# Patient Record
Sex: Female | Born: 1945 | Race: White | Hispanic: No | Smoking: Never smoker
Health system: Southern US, Community
[De-identification: ages and names within clinical notes are randomized; demographics above are authoritative.]

## PROBLEM LIST (undated history)

## (undated) DIAGNOSIS — N189 Chronic kidney disease, unspecified: Secondary | ICD-10-CM

## (undated) DIAGNOSIS — M4800 Spinal stenosis, site unspecified: Secondary | ICD-10-CM

## (undated) DIAGNOSIS — I10 Essential (primary) hypertension: Secondary | ICD-10-CM

## (undated) DIAGNOSIS — K4090 Unilateral inguinal hernia, without obstruction or gangrene, not specified as recurrent: Secondary | ICD-10-CM

---

## 1979-08-07 HISTORY — PX: ABDOMINAL HYSTERECTOMY: SHX81

## 2009-06-17 ENCOUNTER — Emergency Department: Payer: Self-pay | Admitting: Emergency Medicine

## 2010-07-06 ENCOUNTER — Ambulatory Visit: Payer: Self-pay | Admitting: Family Medicine

## 2011-09-14 ENCOUNTER — Ambulatory Visit: Payer: Self-pay | Admitting: Family Medicine

## 2011-09-19 ENCOUNTER — Ambulatory Visit: Payer: Self-pay | Admitting: Family Medicine

## 2011-09-25 ENCOUNTER — Ambulatory Visit: Payer: Self-pay | Admitting: Internal Medicine

## 2011-09-26 ENCOUNTER — Ambulatory Visit: Payer: Self-pay | Admitting: Internal Medicine

## 2012-11-13 IMAGING — CR DG CHEST 2V
1 series · 2 of 2 positions shown · non-contrast
Comparison: none

REASON FOR EXAM: wheezing
COMMENTS:

[Series 1: pa · 0.17mm/px · 2 of 2 slices shown]
[im 1/2]
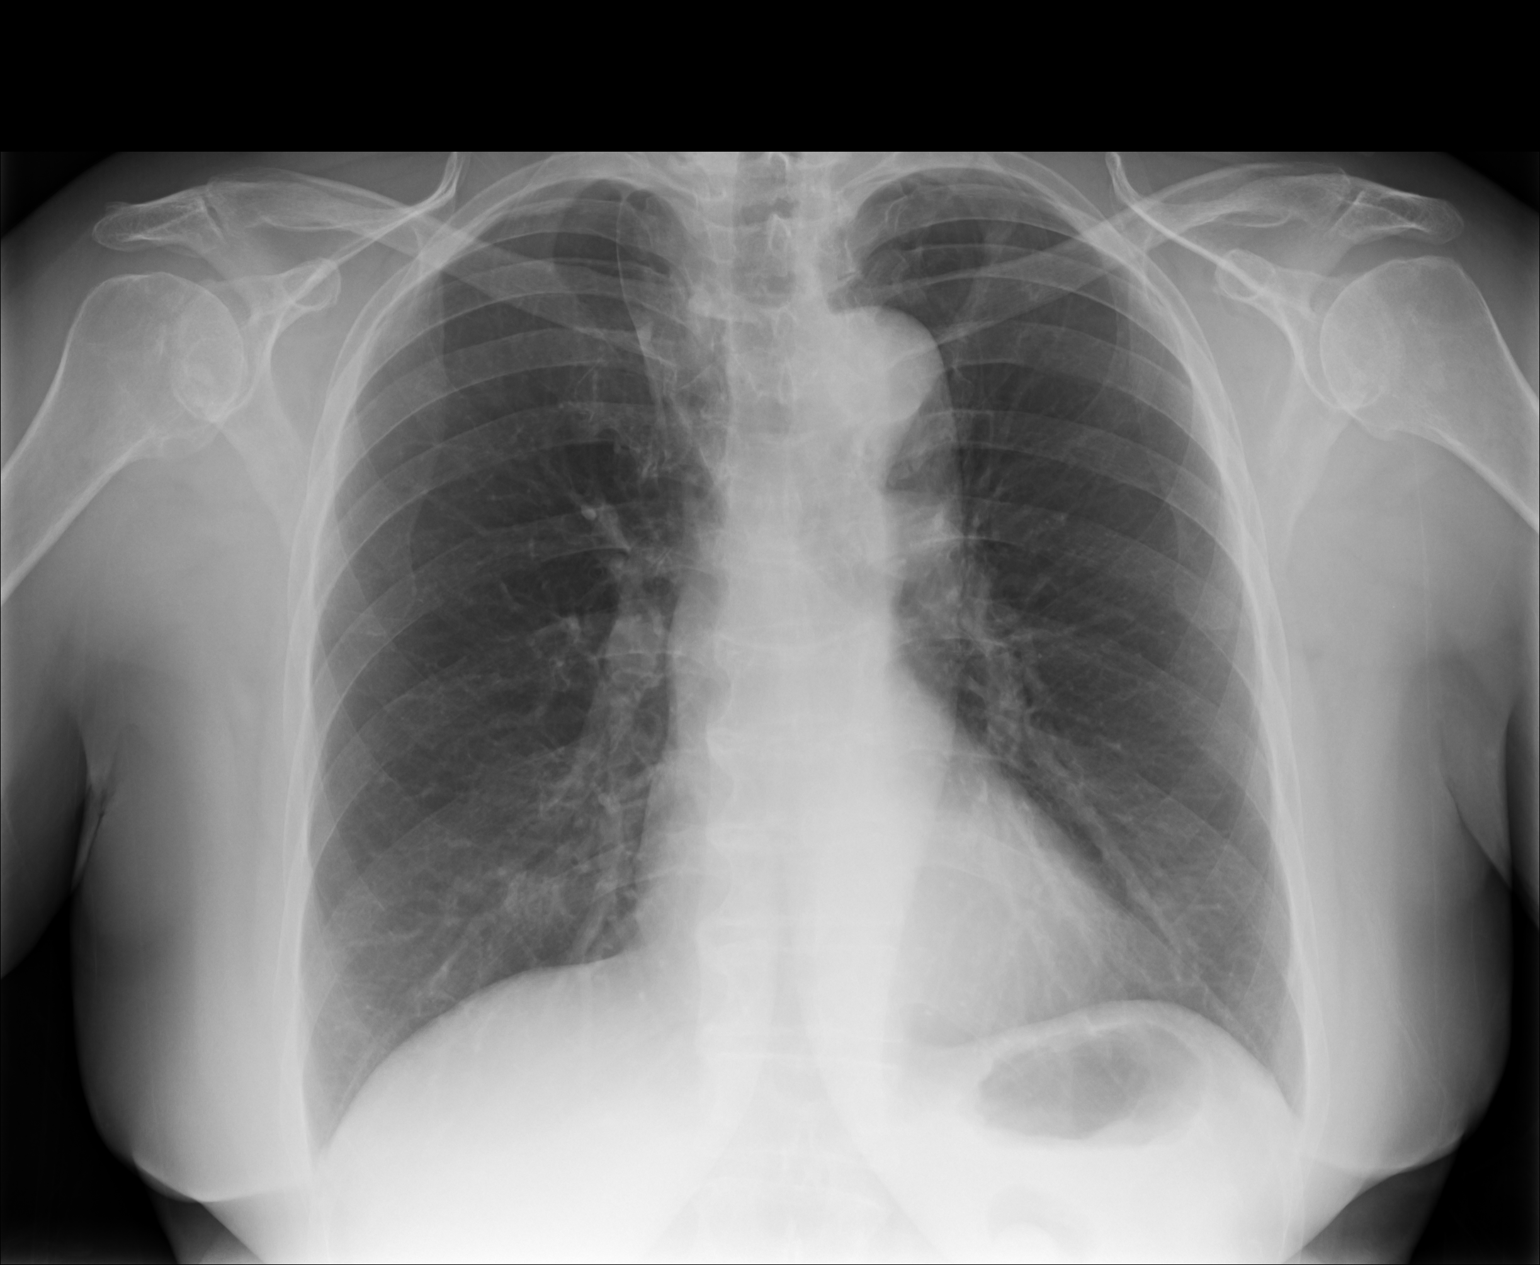
[im 2/2]
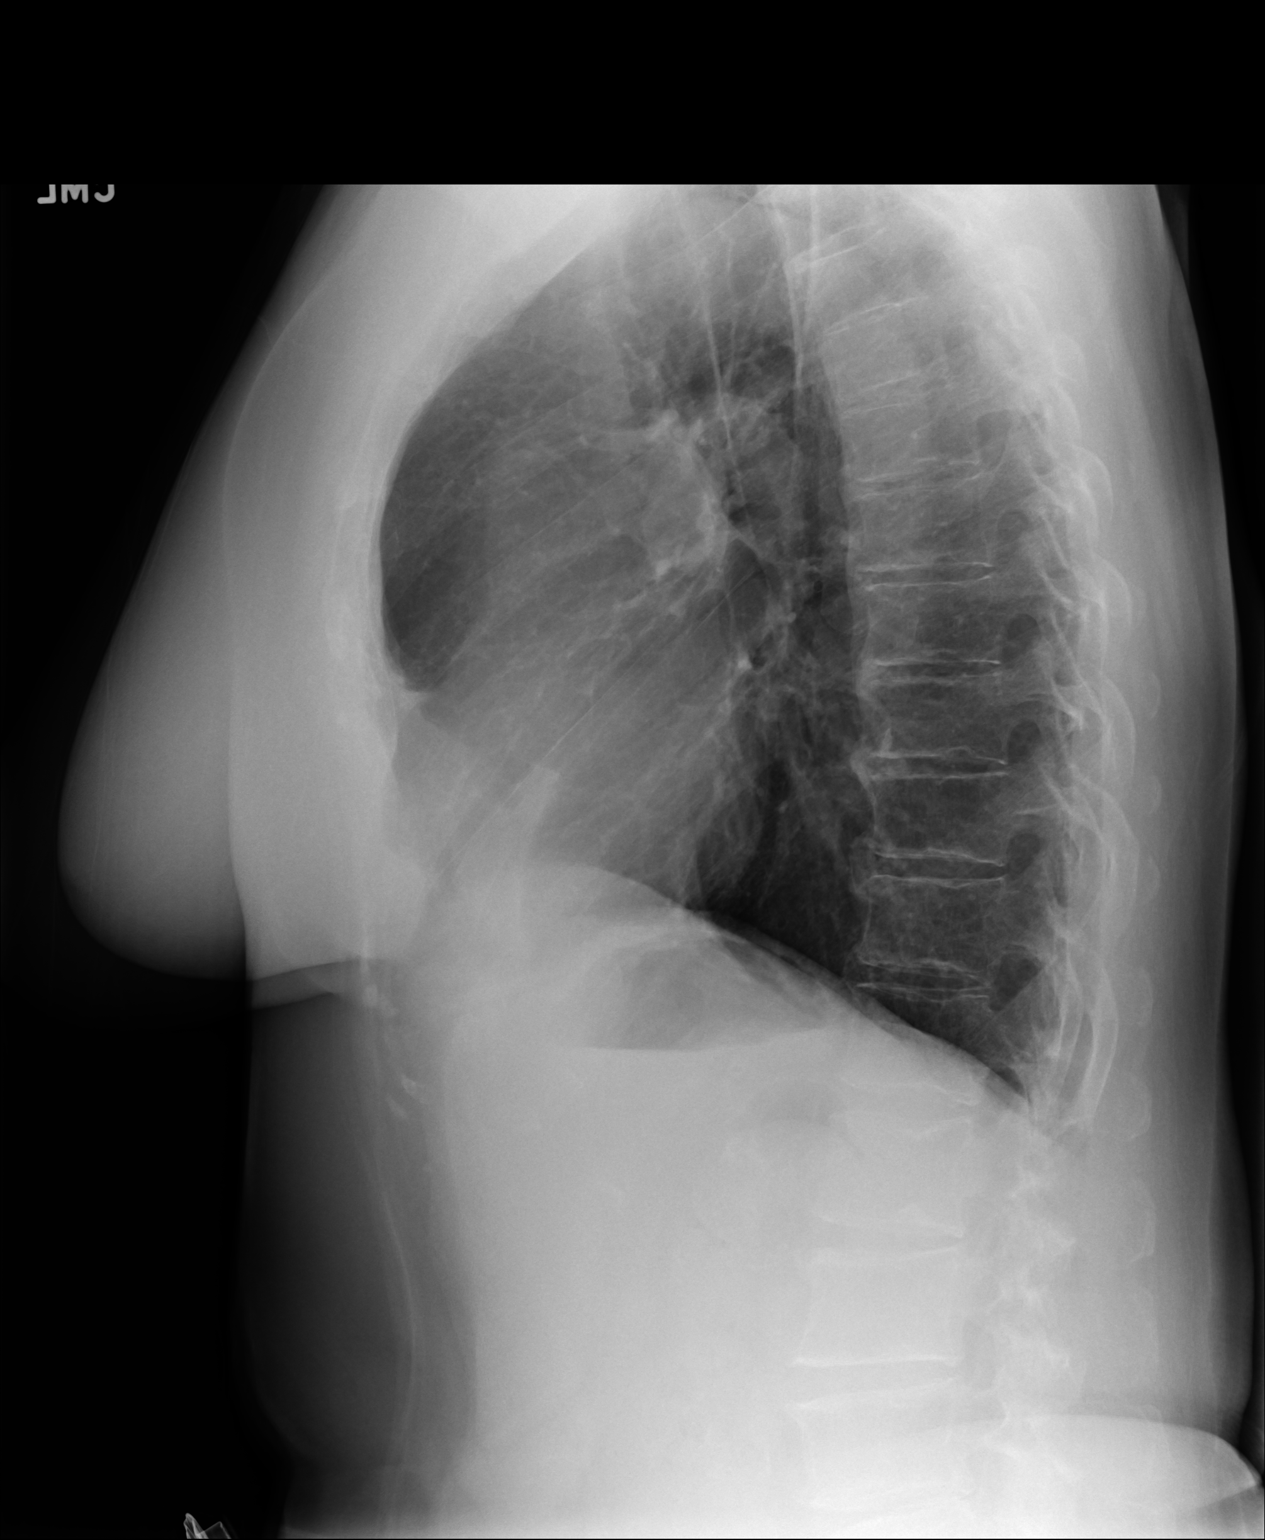

[2 of 2 positions shown; findings below may reference images not displayed]

PROCEDURE:     MDR - MDR CHEST PA(OR AP) AND LATERAL  - September 14, 2011 [DATE]

RESULT:     The lung fields are clear. No pneumonia, pneumothorax or pleural
effusion is seen. Heart size is normal. The lungs are bilaterally
hyperinflated which suggests a history of COPD or asthma. Incidental note is
made of an azygos lobe on the right, a normal variant.
IMPRESSION: 1. The lung fields are clear.
2. Heart size is normal.
3. The lungs appear bilaterally hyperinflated compatible with a history of
COPD or asthma.

## 2012-11-18 IMAGING — US US THYROID
1 series · 17 of 25 positions shown · non-contrast
Comparison: none

REASON FOR EXAM: thyroid nodule goiter
COMMENTS:

[Series 1: us thyroid · 17 of 104 slices shown]
[im 1/104]
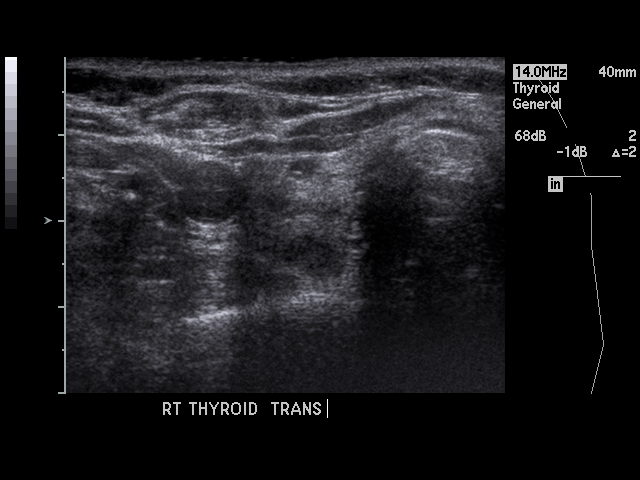
[im 9/104]
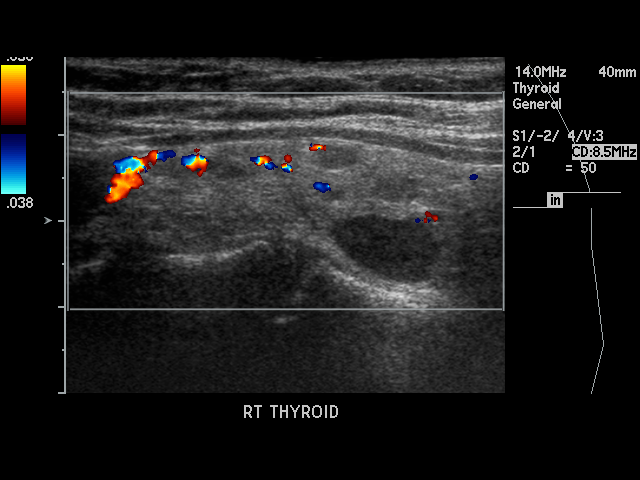
[im 13/104]
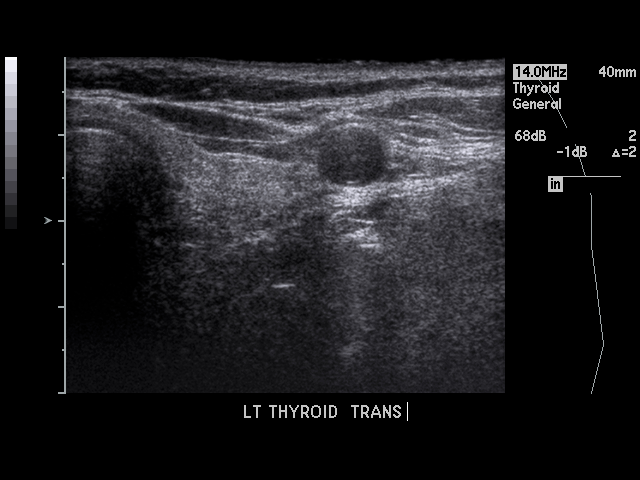
[im 22/104]
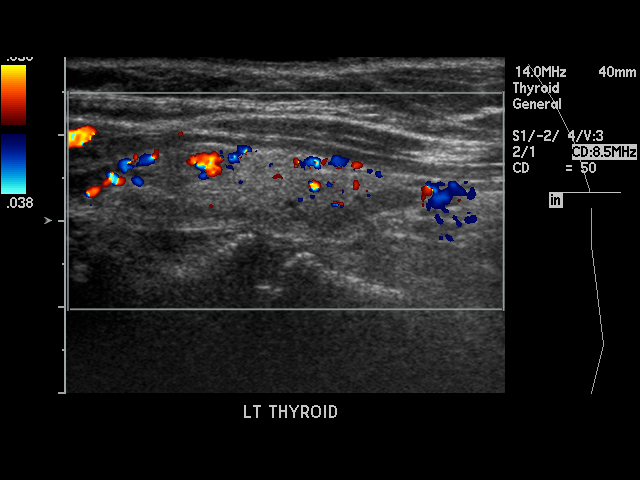
[im 26/104]
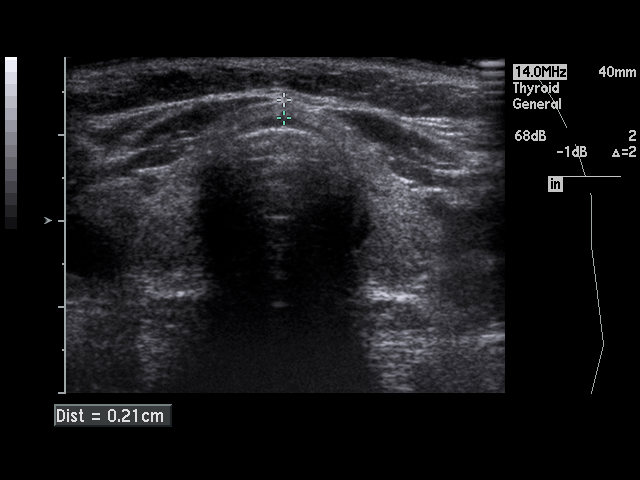
[im 35/104]
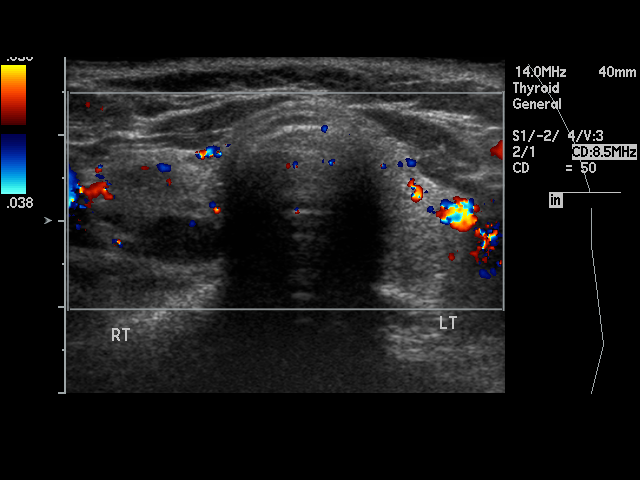
[im 39/104]
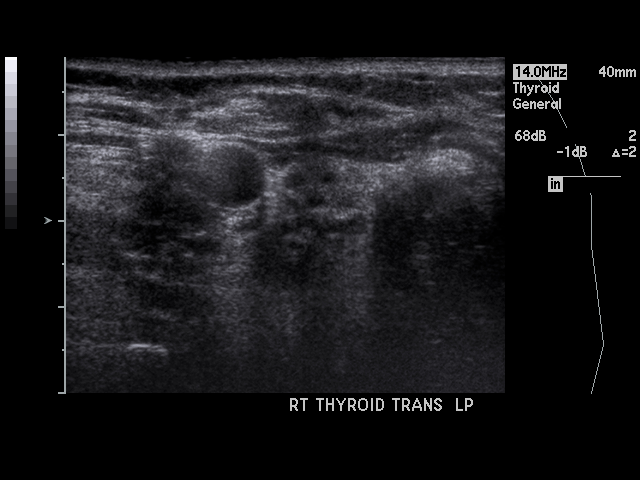
[im 48/104]
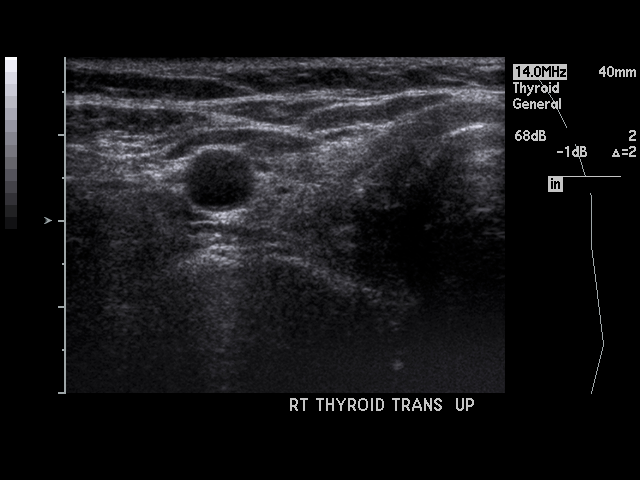
[im 52/104]
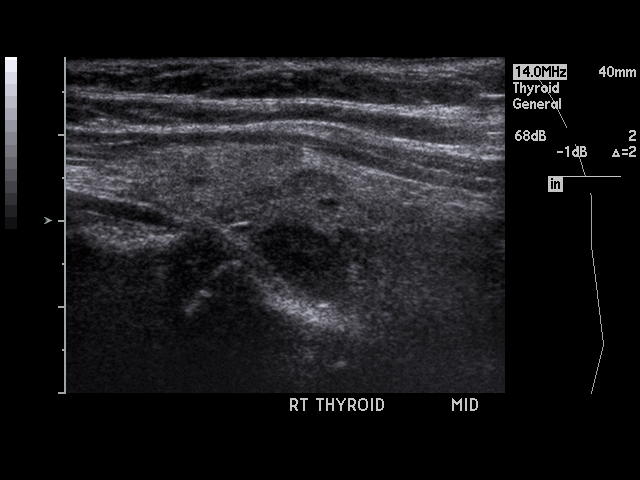
[im 56/104]
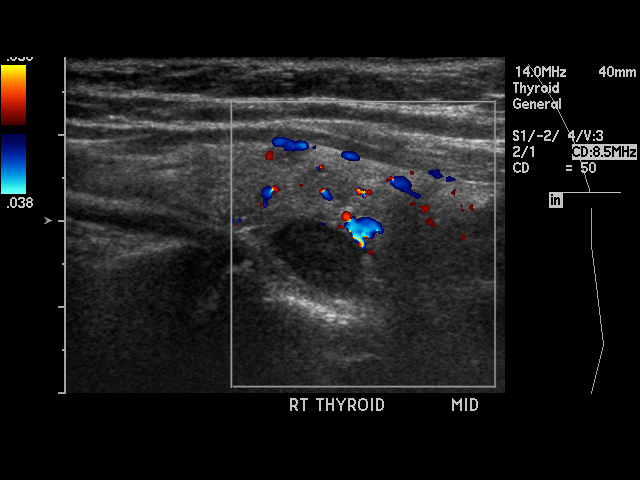
[im 65/104]
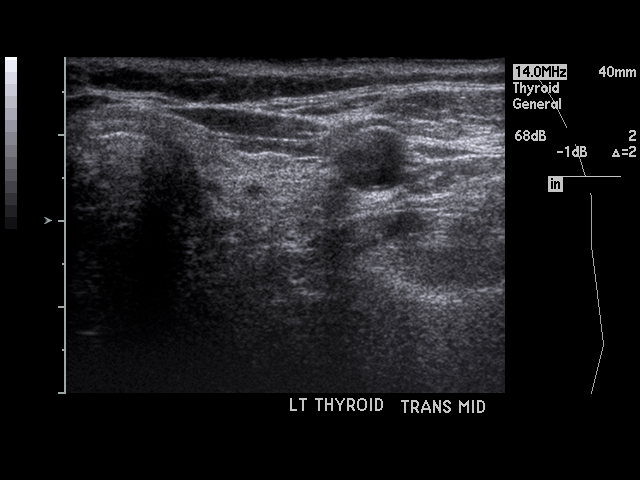
[im 69/104]
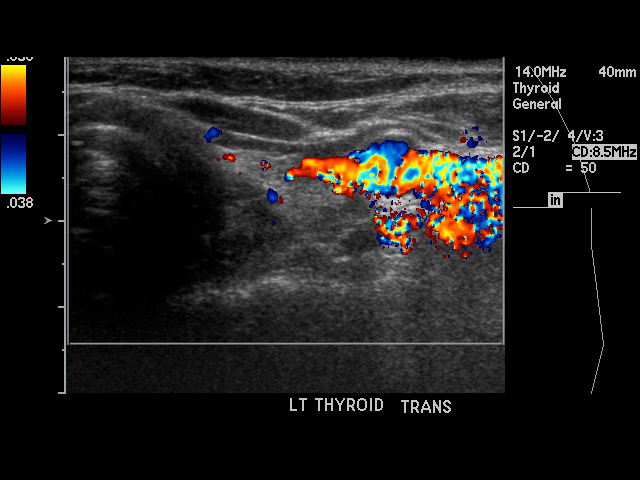
[im 78/104]
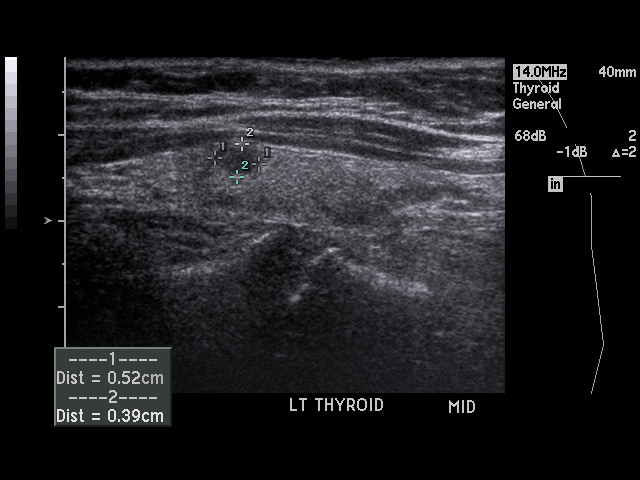
[im 82/104]
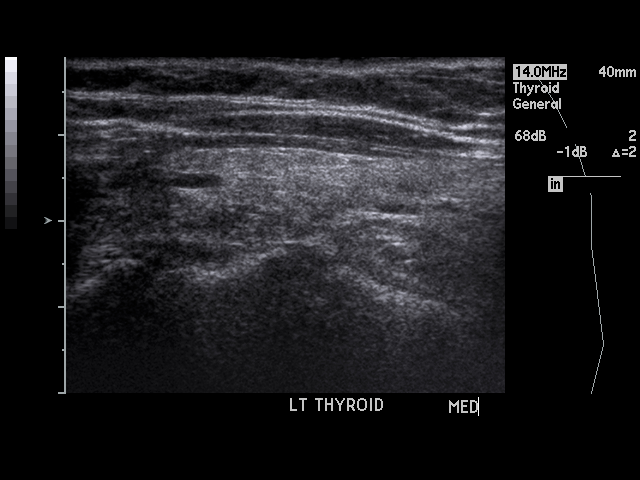
[im 91/104]
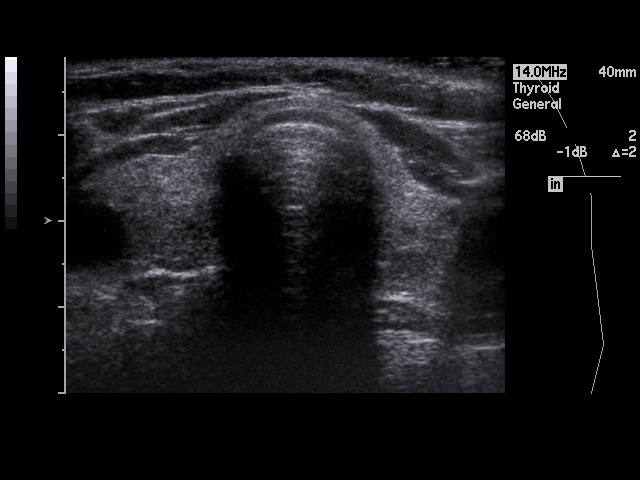
[im 95/104]
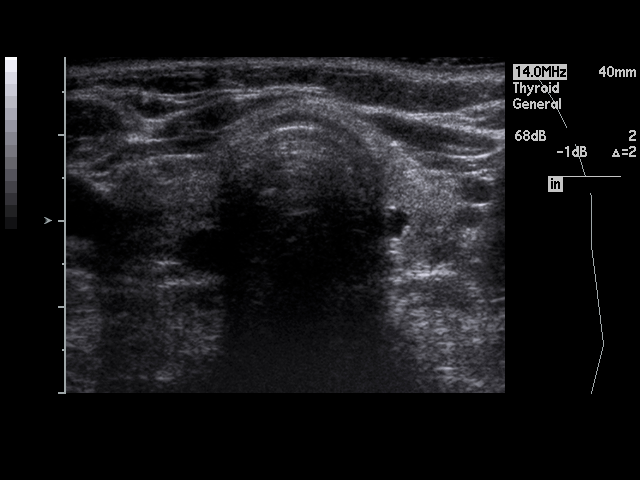
[im 104/104]
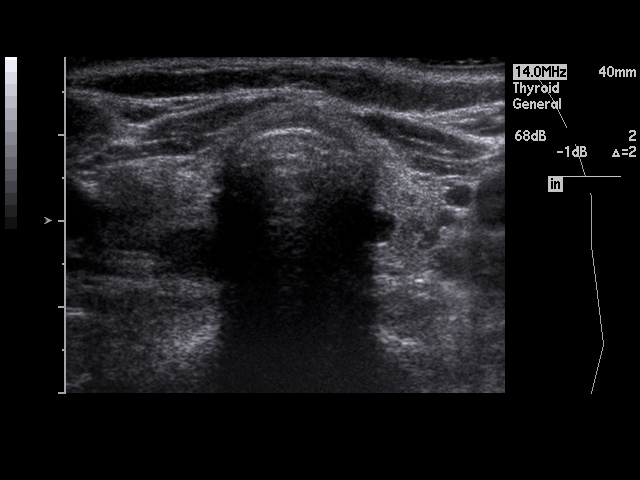

[17 of 25 positions shown; findings below may reference images not displayed]

PROCEDURE:     KARL-BERTIL - KARL-BERTIL THYROID  - September 19, 2011  [DATE]

RESULT:

Sonographic assessment of the thyroid shows the right lobe measures 4.1 x
1.8 x 1.4 cm while the left measures 3.7 x 0.9 x 1.5 cm. The isthmus
measures 2.1 mm anterior to posterior. The thyroid lobes show a somewhat
heterogeneous echotexture with some areas of well defined, solid appearing
hypoechogenicity. The largest is in the lower lobe on the right measuring up
to 1.2 x 0.8 x 1.0 cm. The second largest is in the mid lateral left thyroid
region measuring up to 0.5 x 0.4 x 0.4 cm. No definite calcification is seen.
IMPRESSION: Multinodular thyroid as described. No evidence of
thyromegaly.

## 2022-08-07 ENCOUNTER — Ambulatory Visit: Payer: Self-pay | Admitting: General Surgery

## 2022-08-07 NOTE — H&P (Signed)
PATIENT PROFILE: Rebekah Briggs is a 77 y.o. female who presents to the Clinic for consultation at the request of Dr. Clemmie Krill for evaluation of bilateral inguinal hernia.  PCP:  Rebekah Number, MD  HISTORY OF PRESENT ILLNESS: Rebekah Briggs reports she has been having a left inguinal hernia since 1996.  Now she feels a hernia on the right groin since a year ago.  She endorses that last week the right side inguinal hernia was tender and difficult to reduce.  Pain localized to the groin.  No pain radiation.  Aggravating factor was applying pressure.  Alleviating factor was able to reduce the hernia.  Patient denies episode of abdominal distention, nausea or vomiting.  Patient denies any previous groin surgery.   PROBLEM LIST: Hypertension Bilateral inguinal hernia  GENERAL REVIEW OF SYSTEMS:   General ROS: negative for - chills, fatigue, fever, weight gain or weight loss Allergy and Immunology ROS: negative for - hives  Hematological and Lymphatic ROS: negative for - bleeding problems or bruising, negative for palpable nodes Endocrine ROS: negative for - heat or cold intolerance, hair changes Respiratory ROS: negative for - cough, shortness of breath or wheezing Cardiovascular ROS: no chest pain or palpitations GI ROS: negative for nausea, vomiting, abdominal pain, diarrhea, constipation Musculoskeletal ROS: negative for - joint swelling or muscle pain Neurological ROS: negative for - confusion, syncope Dermatological ROS: negative for pruritus and rash Psychiatric: negative for anxiety, depression, difficulty sleeping and memory loss  MEDICATIONS: Current Outpatient Medications  Medication Sig Dispense Refill   aspirin 325 MG tablet Take 325 mg by mouth once daily     cyclobenzaprine (FLEXERIL) 10 MG tablet Take 10 mg by mouth as needed     hydroCHLOROthiazide (HYDRODIURIL) 25 MG tablet Take 25 mg by mouth once daily     metoprolol tartrate (LOPRESSOR) 100 MG tablet Take 100 mg  by mouth 2 (two) times daily     No current facility-administered medications for this visit.    ALLERGIES: Patient has no known allergies.  PAST MEDICAL HISTORY: Past Medical History:  Diagnosis Date   HTN (hypertension)    Kidney disease    Spinal stenosis     PAST SURGICAL HISTORY: Past Surgical History:  Procedure Laterality Date   HYSTERECTOMY VAGINAL  03/1979     FAMILY HISTORY: Family History  Problem Relation Age of Onset   Rheum arthritis Mother    Heart disease Father    Stroke Son      SOCIAL HISTORY: Social History   Socioeconomic History   Marital status: Widowed  Tobacco Use   Smoking status: Never    Passive exposure: Never   Smokeless tobacco: Never  Vaping Use   Vaping Use: Never used  Substance and Sexual Activity   Alcohol use: Never   Drug use: Never    PHYSICAL EXAM: Vitals:   08/07/22 0849  BP: (!) 192/89  Pulse: 63   Body mass index is 28.68 kg/m. Weight: 64.4 kg (142 lb)   GENERAL: Alert, active, oriented x3  HEENT: Pupils equal reactive to light. Extraocular movements are intact. Sclera clear. Palpebral conjunctiva normal red color.Pharynx clear.  NECK: Supple with no palpable mass and no adenopathy.  LUNGS: Sound clear with no rales rhonchi or wheezes.  HEART: Regular rhythm S1 and S2 without murmur.  ABDOMEN: Soft and depressible, nontender with no palpable mass, no hepatomegaly.  Bilateral inguinal hernia, reducible  EXTREMITIES: Well-developed well-nourished symmetrical with no dependent edema.  NEUROLOGICAL: Awake alert oriented, facial expression  symmetrical, moving all extremities.  REVIEW OF DATA: I have reviewed the following data today: No results found for any previous visit.     ASSESSMENT: Rebekah Briggs is a 77 y.o. female presenting for consultation for lateral inguinal hernia.    The patient presents with a symptomatic, reducible bilateral inguinal hernia. Patient was oriented about the diagnosis of  inguinal hernia and its implication. The patient was oriented about the treatment alternatives (observation vs surgical repair). Due to patient symptoms, repair is recommended. Patient oriented about the surgical procedure, the use of mesh and its risk of complications such as: infection, bleeding, injury to vas deference, vasculature and testicle, injury to bowel or bladder, and chronic pain.   Non-recurrent bilateral inguinal hernia without obstruction or gangrene [K40.20]  PLAN: 1.  Robotic assisted laparoscopic bilateral inguinal hernia repair with mesh (65993) 2.  CBC, CMP 3.  Hold aspirin 5 days before procedure 4.  Contact us if has any question or concern.   Patient verbalized understanding, all questions were answered, and were agreeable with the plan outlined above.    Herbert Pun, MD  Electronically signed by Herbert Pun, MD

## 2022-08-07 NOTE — H&P (View-Only) (Signed)
PATIENT PROFILE: Jannice Beitzel is a 77 y.o. female who presents to the Clinic for consultation at the request of Dr. Clemmie Krill for evaluation of bilateral inguinal hernia.  PCP:  Rich Number, MD  HISTORY OF PRESENT ILLNESS: [name] reports she has been having a left inguinal hernia since 1996.  Now she feels a hernia on the right groin since a year ago.  She endorses that last week the right side inguinal hernia was tender and difficult to reduce.  Pain localized to the groin.  No pain radiation.  Aggravating factor was applying pressure.  Alleviating factor was able to reduce the hernia.  Patient denies episode of abdominal distention, nausea or vomiting.  Patient denies any previous groin surgery.   PROBLEM LIST: Hypertension Bilateral inguinal hernia  GENERAL REVIEW OF SYSTEMS:   General ROS: negative for - chills, fatigue, fever, weight gain or weight loss Allergy and Immunology ROS: negative for - hives  Hematological and Lymphatic ROS: negative for - bleeding problems or bruising, negative for palpable nodes Endocrine ROS: negative for - heat or cold intolerance, hair changes Respiratory ROS: negative for - cough, shortness of breath or wheezing Cardiovascular ROS: no chest pain or palpitations GI ROS: negative for nausea, vomiting, abdominal pain, diarrhea, constipation Musculoskeletal ROS: negative for - joint swelling or muscle pain Neurological ROS: negative for - confusion, syncope Dermatological ROS: negative for pruritus and rash Psychiatric: negative for anxiety, depression, difficulty sleeping and memory loss  MEDICATIONS: Current Outpatient Medications  Medication Sig Dispense Refill   aspirin 325 MG tablet Take 325 mg by mouth once daily     cyclobenzaprine (FLEXERIL) 10 MG tablet Take 10 mg by mouth as needed     hydroCHLOROthiazide (HYDRODIURIL) 25 MG tablet Take 25 mg by mouth once daily     metoprolol tartrate (LOPRESSOR) 100 MG tablet Take 100 mg  by mouth 2 (two) times daily     No current facility-administered medications for this visit.    ALLERGIES: Patient has no known allergies.  PAST MEDICAL HISTORY: Past Medical History:  Diagnosis Date   HTN (hypertension)    Kidney disease    Spinal stenosis     PAST SURGICAL HISTORY: Past Surgical History:  Procedure Laterality Date   HYSTERECTOMY VAGINAL  03/1979     FAMILY HISTORY: Family History  Problem Relation Age of Onset   Rheum arthritis Mother    Heart disease Father    Stroke Son      SOCIAL HISTORY: Social History   Socioeconomic History   Marital status: Widowed  Tobacco Use   Smoking status: Never    Passive exposure: Never   Smokeless tobacco: Never  Vaping Use   Vaping Use: Never used  Substance and Sexual Activity   Alcohol use: Never   Drug use: Never    PHYSICAL EXAM: Vitals:   08/07/22 0849  BP: (!) 192/89  Pulse: 63   Body mass index is 28.68 kg/m. Weight: 64.4 kg (142 lb)   GENERAL: Alert, active, oriented x3  HEENT: Pupils equal reactive to light. Extraocular movements are intact. Sclera clear. Palpebral conjunctiva normal red color.Pharynx clear.  NECK: Supple with no palpable mass and no adenopathy.  LUNGS: Sound clear with no rales rhonchi or wheezes.  HEART: Regular rhythm S1 and S2 without murmur.  ABDOMEN: Soft and depressible, nontender with no palpable mass, no hepatomegaly.  Bilateral inguinal hernia, reducible  EXTREMITIES: Well-developed well-nourished symmetrical with no dependent edema.  NEUROLOGICAL: Awake alert oriented, facial expression  symmetrical, moving all extremities.  REVIEW OF DATA: I have reviewed the following data today: No results found for any previous visit.     ASSESSMENT: Ms. Balboni is a 76 y.o. female presenting for consultation for lateral inguinal hernia.    The patient presents with a symptomatic, reducible bilateral inguinal hernia. Patient was oriented about the diagnosis of  inguinal hernia and its implication. The patient was oriented about the treatment alternatives (observation vs surgical repair). Due to patient symptoms, repair is recommended. Patient oriented about the surgical procedure, the use of mesh and its risk of complications such as: infection, bleeding, injury to vas deference, vasculature and testicle, injury to bowel or bladder, and chronic pain.   Non-recurrent bilateral inguinal hernia without obstruction or gangrene [K40.20]  PLAN: 1.  Robotic assisted laparoscopic bilateral inguinal hernia repair with mesh ([id number]) 2.  CBC, CMP 3.  Hold aspirin 5 days before procedure 4.  Contact us if has any question or concern.   Patient verbalized understanding, all questions were answered, and were agreeable with the plan outlined above.    Benen Weida Cintron-Diaz, MD  Electronically signed by Adel Neyer Cintron-Diaz, MD  

## 2022-08-09 ENCOUNTER — Inpatient Hospital Stay: Admission: RE | Admit: 2022-08-09 | Payer: Self-pay | Source: Ambulatory Visit

## 2022-08-10 ENCOUNTER — Encounter
Admission: RE | Admit: 2022-08-10 | Discharge: 2022-08-10 | Disposition: A | Discharge status: Home / Self Care | Payer: Medicare Other | Source: Ambulatory Visit | Attending: General Surgery | Admitting: General Surgery

## 2022-08-10 VITALS — Ht 59.0 in | Wt 142.0 lb

## 2022-08-10 DIAGNOSIS — I10 Essential (primary) hypertension: Secondary | ICD-10-CM

## 2022-08-10 HISTORY — DX: Chronic kidney disease, unspecified: N18.9

## 2022-08-10 HISTORY — DX: Essential (primary) hypertension: I10

## 2022-08-10 HISTORY — DX: Unilateral inguinal hernia, without obstruction or gangrene, not specified as recurrent: K40.90

## 2022-08-10 HISTORY — DX: Spinal stenosis, site unspecified: M48.00

## 2022-08-10 NOTE — Patient Instructions (Addendum)
Your procedure is scheduled on: Wednesday August 15, 2022. Report to Day Surgery inside [facility] 2nd floor, stop by registration desk before getting on elevator.  To find out your arrival time please call [phone] between 1PM - 3PM on Tuesday August 14, 2022.  Remember: Instructions that are not followed completely may result in serious medical risk,  up to and including death, or upon the discretion of your surgeon and anesthesiologist your  surgery may need to be rescheduled.     _X__ 1. Do not eat food or drink fluids after midnight the night before your procedure.                 No chewing gum or hard candies.   __X__2.  On the morning of surgery brush your teeth with toothpaste and water, you                may rinse your mouth with mouthwash if you wish.  Do not swallow any toothpaste or mouthwash.     _X__ 3.  No Alcohol for 24 hours before or after surgery.   _X__ 4.  Do Not Smoke or use e-cigarettes For 24 Hours Prior to Your Surgery.                 Do not use any chewable tobacco products for at least 6 hours prior to                 Surgery.  _X__  5.  Do not use any recreational drugs (marijuana, cocaine, heroin, ecstasy, MDMA or other)                For at least one week prior to your surgery.  Combination of these drugs with anesthesia                May have life threatening results.  ____  6.  Bring all medications with you on the day of surgery if instructed.   __X__  7.  Notify your doctor if there is any change in your medical condition      (cold, fever, infections).     Do not wear jewelry, make-up, hairpins, clips or nail polish. Do not wear lotions, powders, or perfumes. You may wear deodorant. Do not shave 48 hours prior to surgery. Men may shave face and neck. Do not bring valuables to the hospital.    [facility] is not responsible for any belongings or valuables.  Contacts, dentures or bridgework may not be worn into  surgery. Leave your suitcase in the car. After surgery it may be brought to your room. For patients admitted to the hospital, discharge time is determined by your treatment team.   Patients discharged the day of surgery will not be allowed to drive home.   Make arrangements for someone to be with you for the first 24 hours of your Same Day Discharge.   __X__ Take these medicines the morning of surgery with A SIP OF WATER:    1. metoprolol tartrate (LOPRESSOR) 100 MG   2.   3.   4.  5.  6.  ____ Fleet Enema (as directed)   __X__ Use CHG Soap (or wipes) as directed  ____ Use Benzoyl Peroxide Gel as instructed  ____ Use inhalers on the day of surgery  ____ Stop metformin 2 days prior to surgery    ____ Take 1/2 of usual insulin dose the night before surgery. No insulin the morning  of surgery.   __X__ Stop aspirin 325 mg 5 days prior to surgery as instructed by your doctor.   __X__ One Week prior to surgery- Stop Anti-inflammatories such as Ibuprofen, Aleve, Advil, Motrin, meloxicam (MOBIC), diclofenac, etodolac, ketorolac, Toradol, Daypro, piroxicam, Goody's or BC powders. OK TO USE TYLENOL IF NEEDED   __X__ Stop supplements until after surgery.    ____ Bring C-Pap to the hospital.    If you have any questions regarding your pre-procedure instructions,  Please call Pre-admit Testing at [phone]    Preparing for Surgery with CHLORHEXIDINE GLUCONATE (CHG) Soap  Chlorhexidine Gluconate (CHG) Soap  o An antiseptic cleaner that kills germs and bonds with the skin to continue killing germs even after washing  o Used for showering the night before surgery and morning of surgery  Before surgery, you can play an important role by reducing the number of germs on your skin.  CHG (Chlorhexidine gluconate) soap is an antiseptic cleanser which kills germs and bonds with the skin to continue killing germs even after washing.  Please do not use if you have an  allergy to CHG or antibacterial soaps. If your skin becomes reddened/irritated stop using the CHG.  1. Shower the NIGHT BEFORE SURGERY and the MORNING OF SURGERY with CHG soap.  2. If you choose to wash your hair, wash your hair first as usual with your normal shampoo.  3. After shampooing, rinse your hair and body thoroughly to remove the shampoo.  4. Use CHG as you would any other liquid soap. You can apply CHG directly to the skin and wash gently with a scrungie or a clean washcloth.  5. Apply the CHG soap to your body only from the neck down. Do not use on open wounds or open sores. Avoid contact with your eyes, ears, mouth, and genitals (private parts). Wash face and genitals (private parts) with your normal soap.  6. Wash thoroughly, paying special attention to the area where your surgery will be performed.  7. Thoroughly rinse your body with warm water.  8. Do not shower/wash with your normal soap after using and rinsing off the CHG soap.  9. Pat yourself dry with a clean towel.  10. Wear clean pajamas to bed the night before surgery.  12. Place clean sheets on your bed the night of your first shower and do not sleep with pets.  13. Shower again with the CHG soap on the day of surgery prior to arriving at the hospital.  14. Do not apply any deodorants/lotions/powders.  15. Please wear clean clothes to the hospital.

## 2022-08-13 ENCOUNTER — Encounter
Admission: RE | Admit: 2022-08-13 | Discharge: 2022-08-13 | Disposition: A | Discharge status: Home / Self Care | Payer: Medicare Other | Source: Ambulatory Visit | Attending: General Surgery | Admitting: General Surgery

## 2022-08-13 DIAGNOSIS — R9431 Abnormal electrocardiogram [ECG] [EKG]: Secondary | ICD-10-CM | POA: Diagnosis not present

## 2022-08-13 DIAGNOSIS — I10 Essential (primary) hypertension: Secondary | ICD-10-CM

## 2022-08-15 ENCOUNTER — Other Ambulatory Visit: Payer: Self-pay

## 2022-08-15 ENCOUNTER — Ambulatory Visit: Payer: Medicare Other

## 2022-08-15 ENCOUNTER — Ambulatory Visit
Admission: RE | Admit: 2022-08-15 | Discharge: 2022-08-15 | Disposition: A | Discharge status: Home / Self Care | Payer: Medicare Other | Attending: General Surgery | Admitting: General Surgery

## 2022-08-15 ENCOUNTER — Encounter
Admission: RE | Disposition: A | Discharge status: Home / Self Care | Payer: Self-pay | Source: Home / Self Care | Attending: General Surgery

## 2022-08-15 ENCOUNTER — Encounter: Payer: Self-pay | Admitting: General Surgery

## 2022-08-15 DIAGNOSIS — I129 Hypertensive chronic kidney disease with stage 1 through stage 4 chronic kidney disease, or unspecified chronic kidney disease: Secondary | ICD-10-CM | POA: Insufficient documentation

## 2022-08-15 DIAGNOSIS — K4020 Bilateral inguinal hernia, without obstruction or gangrene, not specified as recurrent: Secondary | ICD-10-CM | POA: Insufficient documentation

## 2022-08-15 DIAGNOSIS — N189 Chronic kidney disease, unspecified: Secondary | ICD-10-CM | POA: Diagnosis not present

## 2022-08-15 HISTORY — PX: INSERTION OF MESH: SHX5868

## 2022-08-15 SURGERY — REPAIR, HERNIA, INGUINAL, BILATERAL, ROBOT-ASSISTED
Anesthesia: General | Site: Groin | Laterality: Bilateral

## 2022-08-15 MED ORDER — FAMOTIDINE 20 MG PO TABS
ORAL_TABLET | ORAL | Status: AC
  Administered 2022-08-15: 20 mg via ORAL
  Filled 2022-08-15: qty 1

## 2022-08-15 MED ORDER — CEFAZOLIN SODIUM-DEXTROSE 2-4 GM/100ML-% IV SOLN
2.0000 g | INTRAVENOUS | Status: AC
  Administered 2022-08-15: 2 g via INTRAVENOUS

## 2022-08-15 MED ORDER — ACETAMINOPHEN 10 MG/ML IV SOLN
INTRAVENOUS | Status: DC | PRN
  Administered 2022-08-15: 1000 mg via INTRAVENOUS

## 2022-08-15 MED ORDER — PROPOFOL 1000 MG/100ML IV EMUL
INTRAVENOUS | Status: AC
  Filled 2022-08-15: qty 100

## 2022-08-15 MED ORDER — ALBUTEROL SULFATE HFA 108 (90 BASE) MCG/ACT IN AERS
INHALATION_SPRAY | RESPIRATORY_TRACT | Status: DC | PRN
  Administered 2022-08-15: 4 via RESPIRATORY_TRACT

## 2022-08-15 MED ORDER — FENTANYL CITRATE (PF) 250 MCG/5ML IJ SOLN
INTRAMUSCULAR | Status: DC | PRN
  Administered 2022-08-15: 25 ug via INTRAVENOUS
  Administered 2022-08-15: 50 ug via INTRAVENOUS
  Administered 2022-08-15: 25 ug via INTRAVENOUS

## 2022-08-15 MED ORDER — PHENYLEPHRINE HCL-NACL 20-0.9 MG/250ML-% IV SOLN
INTRAVENOUS | Status: AC
  Filled 2022-08-15: qty 250

## 2022-08-15 MED ORDER — CEFAZOLIN SODIUM-DEXTROSE 2-4 GM/100ML-% IV SOLN
INTRAVENOUS | Status: AC
  Filled 2022-08-15: qty 100

## 2022-08-15 MED ORDER — PROPOFOL 10 MG/ML IV BOLUS
INTRAVENOUS | Status: AC
  Filled 2022-08-15: qty 20

## 2022-08-15 MED ORDER — OXYCODONE HCL 5 MG PO TABS
ORAL_TABLET | ORAL | Status: AC
  Filled 2022-08-15: qty 1

## 2022-08-15 MED ORDER — CHLORHEXIDINE GLUCONATE 0.12 % MT SOLN: 15.0000 mL | Freq: Once | OROMUCOSAL | Status: AC

## 2022-08-15 MED ORDER — CHLORHEXIDINE GLUCONATE 0.12 % MT SOLN
OROMUCOSAL | Status: AC
  Administered 2022-08-15: 15 mL via OROMUCOSAL
  Filled 2022-08-15: qty 15

## 2022-08-15 MED ORDER — ORAL CARE MOUTH RINSE: 15.0000 mL | Freq: Once | OROMUCOSAL | Status: AC

## 2022-08-15 MED ORDER — ROCURONIUM BROMIDE 10 MG/ML (PF) SYRINGE
PREFILLED_SYRINGE | INTRAVENOUS | Status: AC
  Filled 2022-08-15: qty 10

## 2022-08-15 MED ORDER — LIDOCAINE HCL (PF) 2 % IJ SOLN
INTRAMUSCULAR | Status: AC
  Filled 2022-08-15: qty 5

## 2022-08-15 MED ORDER — FAMOTIDINE 20 MG PO TABS: 20.0000 mg | ORAL_TABLET | Freq: Once | ORAL | Status: AC

## 2022-08-15 MED ORDER — FENTANYL CITRATE (PF) 100 MCG/2ML IJ SOLN: 25.0000 ug | INTRAMUSCULAR | Status: DC | PRN

## 2022-08-15 MED ORDER — BUPIVACAINE-EPINEPHRINE (PF) 0.5% -1:200000 IJ SOLN
INTRAMUSCULAR | Status: DC | PRN
  Administered 2022-08-15: 30 mL via INTRAMUSCULAR

## 2022-08-15 MED ORDER — LIDOCAINE 2% (20 MG/ML) 5 ML SYRINGE
INTRAMUSCULAR | Status: DC | PRN
  Administered 2022-08-15: 60 mg via INTRAVENOUS

## 2022-08-15 MED ORDER — OXYCODONE HCL 5 MG PO TABS
5.0000 mg | ORAL_TABLET | Freq: Once | ORAL | Status: AC | PRN
  Administered 2022-08-15: 5 mg via ORAL

## 2022-08-15 MED ORDER — BUPIVACAINE-EPINEPHRINE (PF) 0.5% -1:200000 IJ SOLN
INTRAMUSCULAR | Status: AC
  Filled 2022-08-15: qty 30

## 2022-08-15 MED ORDER — GLYCOPYRROLATE 0.2 MG/ML IJ SOLN
INTRAMUSCULAR | Status: DC | PRN
  Administered 2022-08-15: .2 mg via INTRAVENOUS

## 2022-08-15 MED ORDER — FENTANYL CITRATE (PF) 100 MCG/2ML IJ SOLN
INTRAMUSCULAR | Status: AC
  Filled 2022-08-15: qty 2

## 2022-08-15 MED ORDER — PHENYLEPHRINE HCL-NACL 20-0.9 MG/250ML-% IV SOLN
INTRAVENOUS | Status: DC | PRN
  Administered 2022-08-15: 40 ug/min via INTRAVENOUS

## 2022-08-15 MED ORDER — ROCURONIUM BROMIDE 10 MG/ML (PF) SYRINGE
PREFILLED_SYRINGE | INTRAVENOUS | Status: DC | PRN
  Administered 2022-08-15: 10 mg via INTRAVENOUS
  Administered 2022-08-15: 50 mg via INTRAVENOUS
  Administered 2022-08-15: 20 mg via INTRAVENOUS

## 2022-08-15 MED ORDER — OXYCODONE HCL 5 MG/5ML PO SOLN: 5.0000 mg | Freq: Once | ORAL | Status: AC | PRN

## 2022-08-15 MED ORDER — PROPOFOL 500 MG/50ML IV EMUL
INTRAVENOUS | Status: DC | PRN
  Administered 2022-08-15: 150 ug/kg/min via INTRAVENOUS

## 2022-08-15 MED ORDER — ONDANSETRON HCL 4 MG/2ML IJ SOLN
INTRAMUSCULAR | Status: AC
  Filled 2022-08-15: qty 2

## 2022-08-15 MED ORDER — DEXAMETHASONE SODIUM PHOSPHATE 10 MG/ML IJ SOLN
INTRAMUSCULAR | Status: DC | PRN
  Administered 2022-08-15: 10 mg via INTRAVENOUS

## 2022-08-15 MED ORDER — MIDAZOLAM HCL 2 MG/2ML IJ SOLN
INTRAMUSCULAR | Status: DC | PRN
  Administered 2022-08-15: 1 mg via INTRAVENOUS

## 2022-08-15 MED ORDER — ONDANSETRON HCL 4 MG PO TABS: 4.0000 mg | ORAL_TABLET | Freq: Every day | ORAL | 1 refills | Status: AC | PRN

## 2022-08-15 MED ORDER — GLYCOPYRROLATE 0.2 MG/ML IJ SOLN
INTRAMUSCULAR | Status: AC
  Filled 2022-08-15: qty 1

## 2022-08-15 MED ORDER — MIDAZOLAM HCL 2 MG/2ML IJ SOLN
INTRAMUSCULAR | Status: AC
  Filled 2022-08-15: qty 2

## 2022-08-15 MED ORDER — PHENYLEPHRINE 80 MCG/ML (10ML) SYRINGE FOR IV PUSH (FOR BLOOD PRESSURE SUPPORT)
PREFILLED_SYRINGE | INTRAVENOUS | Status: AC
  Filled 2022-08-15: qty 10

## 2022-08-15 MED ORDER — SEVOFLURANE IN SOLN
RESPIRATORY_TRACT | Status: AC
  Filled 2022-08-15: qty 250

## 2022-08-15 MED ORDER — PHENYLEPHRINE 80 MCG/ML (10ML) SYRINGE FOR IV PUSH (FOR BLOOD PRESSURE SUPPORT)
PREFILLED_SYRINGE | INTRAVENOUS | Status: DC | PRN
  Administered 2022-08-15: 160 ug via INTRAVENOUS

## 2022-08-15 MED ORDER — TRAMADOL HCL 50 MG PO TABS: 50.0000 mg | ORAL_TABLET | Freq: Four times a day (QID) | ORAL | 0 refills | Status: AC | PRN

## 2022-08-15 MED ORDER — ALBUTEROL SULFATE HFA 108 (90 BASE) MCG/ACT IN AERS
INHALATION_SPRAY | RESPIRATORY_TRACT | Status: AC
  Filled 2022-08-15: qty 6.7

## 2022-08-15 MED ORDER — SODIUM CHLORIDE 0.9 % IV SOLN: INTRAVENOUS | Status: DC

## 2022-08-15 MED ORDER — PROPOFOL 10 MG/ML IV BOLUS
INTRAVENOUS | Status: DC | PRN
  Administered 2022-08-15: 100 mg via INTRAVENOUS

## 2022-08-15 MED ORDER — DEXAMETHASONE SODIUM PHOSPHATE 10 MG/ML IJ SOLN
INTRAMUSCULAR | Status: AC
  Filled 2022-08-15: qty 1

## 2022-08-15 MED ORDER — ACETAMINOPHEN 10 MG/ML IV SOLN
INTRAVENOUS | Status: AC
  Filled 2022-08-15: qty 100

## 2022-08-15 MED ORDER — SUGAMMADEX SODIUM 200 MG/2ML IV SOLN
INTRAVENOUS | Status: DC | PRN
  Administered 2022-08-15: 200 mg via INTRAVENOUS

## 2022-08-15 SURGICAL SUPPLY — 51 items
ADH SKN CLS APL DERMABOND .7 (GAUZE/BANDAGES/DRESSINGS) ×2
BAG PRESSURE INF REUSE 1000 (BAG) IMPLANT
BLADE SURG SZ11 CARB STEEL (BLADE) ×2 IMPLANT
COVER TIP SHEARS 8 DVNC (MISCELLANEOUS) ×2 IMPLANT
COVER TIP SHEARS 8MM DA VINCI (MISCELLANEOUS) ×2
COVER WAND RF STERILE (DRAPES) ×2 IMPLANT
DERMABOND ADVANCED .7 DNX12 (GAUZE/BANDAGES/DRESSINGS) ×2 IMPLANT
DRAPE ARM DVNC X/XI (DISPOSABLE) ×6 IMPLANT
DRAPE COLUMN DVNC XI (DISPOSABLE) ×2 IMPLANT
DRAPE DA VINCI XI ARM (DISPOSABLE) ×6
DRAPE DA VINCI XI COLUMN (DISPOSABLE) ×2
ELECT REM PT RETURN 9FT ADLT (ELECTROSURGICAL) ×2
ELECTRODE REM PT RTRN 9FT ADLT (ELECTROSURGICAL) ×2 IMPLANT
GLOVE BIO SURGEON STRL SZ 6.5 (GLOVE) ×4 IMPLANT
GLOVE BIOGEL PI IND STRL 6.5 (GLOVE) ×4 IMPLANT
GOWN STRL REUS W/ TWL LRG LVL3 (GOWN DISPOSABLE) ×6 IMPLANT
GOWN STRL REUS W/TWL LRG LVL3 (GOWN DISPOSABLE) ×6
IRRIGATOR SUCT 8 DISP DVNC XI (IRRIGATION / IRRIGATOR) IMPLANT
IRRIGATOR SUCTION 8MM XI DISP (IRRIGATION / IRRIGATOR)
IV CATH ANGIO 12GX3 LT BLUE (NEEDLE) IMPLANT
IV NS 1000ML (IV SOLUTION)
IV NS 1000ML BAXH (IV SOLUTION) IMPLANT
KIT PINK PAD W/HEAD ARE REST (MISCELLANEOUS) ×2
KIT PINK PAD W/HEAD ARM REST (MISCELLANEOUS) ×2 IMPLANT
LABEL OR SOLS (LABEL) IMPLANT
MANIFOLD NEPTUNE II (INSTRUMENTS) ×2 IMPLANT
MESH 3DMAX MID 4X6 LT LRG (Mesh General) IMPLANT
MESH 3DMAX MID 4X6 RT LRG (Mesh General) IMPLANT
MESH 3DMAX MID 5X7 LT XLRG (Mesh General) IMPLANT
MESH 3DMAX MID 5X7 RT XLRG (Mesh General) IMPLANT
NDL INSUFFLATION 14GA 120MM (NEEDLE) ×2 IMPLANT
NEEDLE HYPO 22GX1.5 SAFETY (NEEDLE) ×2 IMPLANT
NEEDLE INSUFFLATION 14GA 120MM (NEEDLE) ×2 IMPLANT
OBTURATOR OPTICAL STANDARD 8MM (TROCAR) ×2
OBTURATOR OPTICAL STND 8 DVNC (TROCAR) ×2
OBTURATOR OPTICALSTD 8 DVNC (TROCAR) ×2 IMPLANT
PACK LAP CHOLECYSTECTOMY (MISCELLANEOUS) ×2 IMPLANT
SEAL CANN UNIV 5-8 DVNC XI (MISCELLANEOUS) ×6 IMPLANT
SEAL XI 5MM-8MM UNIVERSAL (MISCELLANEOUS) ×6
SET TUBE SMOKE EVAC HIGH FLOW (TUBING) ×2 IMPLANT
SOLUTION ELECTROLUBE (MISCELLANEOUS) ×2 IMPLANT
SUT MNCRL 4-0 (SUTURE) ×2
SUT MNCRL 4-0 27XMFL (SUTURE) ×2
SUT VIC AB 2-0 SH 27 (SUTURE) ×4
SUT VIC AB 2-0 SH 27XBRD (SUTURE) ×2 IMPLANT
SUT VLOC 90 S/L VL9 GS22 (SUTURE) ×2 IMPLANT
SUTURE MNCRL 4-0 27XMF (SUTURE) ×2 IMPLANT
TAPE TRANSPORE STRL 2 31045 (GAUZE/BANDAGES/DRESSINGS) IMPLANT
TRAP FLUID SMOKE EVACUATOR (MISCELLANEOUS) ×2 IMPLANT
TRAY FOLEY MTR SLVR 16FR STAT (SET/KITS/TRAYS/PACK) ×2 IMPLANT
WATER STERILE IRR 500ML POUR (IV SOLUTION) ×2 IMPLANT

## 2022-08-15 NOTE — Transfer of Care (Signed)
Immediate Anesthesia Transfer of Care Note  Patient: Rebekah Briggs  Procedure(s) Performed: XI ROBOTIC ASSISTED BILATERAL INGUINAL HERNIA (Bilateral: Groin) INSERTION OF MESH  Patient Location: PACU  Anesthesia Type:General  Level of Consciousness: awake and drowsy  Airway & Oxygen Therapy: Patient Spontanous Breathing and Patient connected to face mask oxygen  Post-op Assessment: Report given to RN and Post -op Vital signs reviewed and stable  Post vital signs: Reviewed and stable  Last Vitals:  Vitals Value Taken Time  BP 108/60 08/15/22 0933  Temp    Pulse 80 08/15/22 0935  Resp 19 08/15/22 0935  SpO2 100 % 08/15/22 0935  Vitals shown include unvalidated device data.  Last Pain:  Vitals:   08/15/22 0933  TempSrc:   PainSc: Asleep         Complications: No notable events documented.

## 2022-08-15 NOTE — Discharge Instructions (Addendum)
?  Diet: Resume home heart healthy regular diet.  ? ?Activity: No heavy lifting >20 pounds (children, pets, laundry, garbage) or strenuous activity until follow-up, but light activity and walking are encouraged. Do not drive or drink alcohol if taking narcotic pain medications. ? ?Wound care: May shower with soapy water and pat dry (do not rub incisions), but no baths or submerging incision underwater until follow-up. (no swimming)  ? ?Medications: Resume all home medications. For mild to moderate pain: acetaminophen (Tylenol) or ibuprofen (if no kidney disease). Combining Tylenol with alcohol can substantially increase your risk of causing liver disease. Narcotic pain medications, if prescribed, can be used for severe pain, though may cause nausea, constipation, and drowsiness. If you do not need the narcotic pain medication, you do not need to fill the prescription. ? ?Call office ([phone]) at any time if any questions, worsening pain, fevers/chills, bleeding, drainage from incision site, or other concerns. ? ? ?AMBULATORY SURGERY  ?DISCHARGE INSTRUCTIONS ? ? ?The drugs that you were given will stay in your system until tomorrow so for the next 24 hours you should not: ? ?Drive an automobile ?Make any legal decisions ?Drink any alcoholic beverage ? ? ?You may resume regular meals tomorrow.  Today it is better to start with liquids and gradually work up to solid foods. ? ?You may eat anything you prefer, but it is better to start with liquids, then soup and crackers, and gradually work up to solid foods. ? ? ?Please notify your doctor immediately if you have any unusual bleeding, trouble breathing, redness and pain at the surgery site, drainage, fever, or pain not relieved by medication. ? ? ? ?Additional Instructions: ? ? ? ? ? ? ? ?Please contact your physician with any problems or Same Day Surgery at [phone], Monday through Friday 6 am to 4 pm, or [facility] number at [phone].  ?

## 2022-08-15 NOTE — Anesthesia Procedure Notes (Signed)
Procedure Name: Intubation Date/Time: 08/15/2022 7:37 AM  Performed by: Lia Foyer, CRNAPre-anesthesia Checklist: Patient identified, Emergency Drugs available, Suction available and Patient being monitored Patient Re-evaluated:Patient Re-evaluated prior to induction Oxygen Delivery Method: Circle system utilized Preoxygenation: Pre-oxygenation with 100% oxygen Induction Type: IV induction Ventilation: Mask ventilation without difficulty Laryngoscope Size: Mac and 3 Grade View: Grade I Tube type: Oral Tube size: 7.0 mm Number of attempts: 1 Airway Equipment and Method: Stylet and Oral airway Placement Confirmation: ETT inserted through vocal cords under direct vision, positive ETCO2 and breath sounds checked- equal and bilateral Secured at: 21 cm Tube secured with: Tape Dental Injury: Teeth and Oropharynx as per pre-operative assessment

## 2022-08-15 NOTE — Interval H&P Note (Signed)
History and Physical Interval Note:  08/15/2022 7:07 AM  Rebekah Briggs  has presented today for surgery, with the diagnosis of K40.20 non recurrent bil inguinal hernia w/o obstruction of gangrene.  The various methods of treatment have been discussed with the patient and family. After consideration of risks, benefits and other options for treatment, the patient has consented to  Procedure(s): XI ROBOTIC ASSISTED BILATERAL INGUINAL HERNIA (Bilateral) as a surgical intervention.  The patient's history has been reviewed, patient examined, no change in status, stable for surgery.  I have reviewed the patient's chart and labs.  Questions were answered to the patient's satisfaction.     Herbert Pun

## 2022-08-15 NOTE — Op Note (Signed)
Preoperative diagnosis: Bilateral inguinal hernia.   Postoperative diagnosis: Bilateral inguinal hernia.  Procedure: Robotic assisted Laparoscopic Transabdominal preperitoneal laparoscopic (TAPP) repair of bilateral inguinal hernia.  Anesthesia: GETA  Surgeon: Dr. Windell Moment  Wound Classification: Clean  Indications:  Patient is a 77 y.o. female developed a symptomatic bilateral inguinal hernia. Repair was indicated.  Findings: 1. Bilateral indirect Inguinal hernia identified 2. Vas deferens and cord structures identified and preserved 3. Bard Extra Large 3D Max MID Anatomical mesh used for repair 4. Adequate hemostasis.   Description of procedure:  The patient was taken to the operating room and the correct side of surgery was verified. The patient was placed supine with right arm tucked at the side. After obtaining adequate anesthesia, the patient's abdomen was prepped and draped in standard sterile fashion. A time-out was completed verifying correct patient, procedure, site, positioning, and implant(s) and/or special equipment prior to beginning this procedure.  An incision was made in a natural skin line above the umbilicus. The fascia was elevated and the Veress needle inserted. Proper position was confirmed by aspiration and saline meniscus test.  The abdomen was insufflated with carbon dioxide to a pressure of 15 mmHg. The patient tolerated insufflation well.  Abdominal cavity was entered using Optiview technique with a millimeter trocar.  No injury was identified.  Another 2 mm trocars were placed lateral to each rectus muscle.  Scissors and bipolar forceps were inserted under direct visualization. At the robotic console: Transverse peritoneal incision is made about 8 cm superior to the inguinal defect on the right side first. Medial to the epigastric vessels, the parietal compartment is dissected to visualize the rectus muscle. This is carried down to the symphysis pubis and the  retropubic space is dissected to expose at least 2 cm contralateral to the midline. Cooper's ligament is exposed and cleared at least 2 cm below the ligament to ensure adequate space for the inferior border of the mesh. Hesselbach's triangle is cleared assessing for a direct hernia. The hernia is reduced dissecting the contents away from the border of the transversalis (white) fascia. Lateral to the epigastric vessels, the dissection is carried out in visceral compartment continuing in the true preperitoneal plane. Indirect hernia sac, was carefully reduced and separated from the cord structures with medial retraction and a combination of blunt/sharp dissection and focused cautery. This dissection was continued until the cord structures are "parietalized" completely, allowing for visualization of the reflected peritoneum that is continuous with the line originating 2 cm below Coopers medially and across the psoas muscle in the lateral compartment.  The internal ring was interrogated for a cord lipoma. The cord lipoma was reduced to the retroperitoneum and seated dorsal to the preperitoneal mesh. Having achieved a complete dissection with a critical view of the entire myopectineal orifice, an XL mesh was then positioned centered at the iliopubic tract with the medial side crossing the midline and the inferior edge positioned 2 cm below Coopers ligament. The lateral aspect of the mesh extended 3-5 cm beyond the lateral edge of the psoas. The mesh is fixated using an interrupted suture placed to the ipsilateral Coopers ligament. A second suture was done at the medial superior aspect of the mesh fixating this to the rectus complex. The left inguinal hernia was fixed using the same technique.   The peritoneal flap is closed with running barbed suture. Additional holes in the peritoneum closed with suture. Preperitoneal space gas aspirated to visualize the peritoneum apposed directly against the mesh and ensure  no  folding, lifting, or buckling of the mesh. Skin is closed, sterile dressings are applied.  The patient tolerated the procedure well and was taken to the postanesthesia care unit in stable condition  Specimen: None  Complications: None  Estimated Blood Loss: 5 mL

## 2022-08-15 NOTE — Anesthesia Preprocedure Evaluation (Signed)
Anesthesia Evaluation  Patient identified by MRN, date of birth, ID band Patient awake    Reviewed: Allergy & Precautions, NPO status , Patient's Chart, lab work & pertinent test results  History of Anesthesia Complications (+) PONV and history of anesthetic complications  Airway Mallampati: II  TM Distance: >3 FB Neck ROM: full    Dental  (+) Chipped, Dental Advidsory Given, Poor Dentition   Pulmonary neg pulmonary ROS, neg COPD   Pulmonary exam normal        Cardiovascular hypertension, (-) angina (-) Past MI negative cardio ROS Normal cardiovascular exam     Neuro/Psych negative neurological ROS  negative psych ROS   GI/Hepatic negative GI ROS, Neg liver ROS,,,  Endo/Other  negative endocrine ROS    Renal/GU      Musculoskeletal   Abdominal   Peds  Hematology negative hematology ROS (+)   Anesthesia Other Findings Past Medical History: No date: Chronic kidney disease No date: Hypertension No date: Inguinal hernia No date: Spinal stenosis  Past Surgical History: 1981: ABDOMINAL HYSTERECTOMY  BMI    Body Mass Index: 28.76 kg/m      Reproductive/Obstetrics negative OB ROS                             Anesthesia Physical Anesthesia Plan  ASA: 3  Anesthesia Plan: General ETT   Post-op Pain Management:    Induction: Intravenous  PONV Risk Score and Plan: 3 and Ondansetron, Dexamethasone, Midazolam, TIVA and Propofol infusion  Airway Management Planned: Oral ETT  Additional Equipment:   Intra-op Plan:   Post-operative Plan: Extubation in OR  Informed Consent: I have reviewed the patients History and Physical, chart, labs and discussed the procedure including the risks, benefits and alternatives for the proposed anesthesia with the patient or authorized representative who has indicated his/her understanding and acceptance.     Dental Advisory Given  Plan Discussed  with: Anesthesiologist, CRNA and Surgeon  Anesthesia Plan Comments: (Patient consented for risks of anesthesia including but not limited to:  - adverse reactions to medications - damage to eyes, teeth, lips or other oral mucosa - nerve damage due to positioning  - sore throat or hoarseness - Damage to heart, brain, nerves, lungs, other parts of body or loss of life  Patient voiced understanding.)       Anesthesia Quick Evaluation

## 2022-08-16 NOTE — Anesthesia Postprocedure Evaluation (Signed)
Anesthesia Post Note  Patient: Rebekah Briggs  Procedure(s) Performed: XI ROBOTIC ASSISTED BILATERAL INGUINAL HERNIA (Bilateral: Groin) INSERTION OF MESH  Patient location during evaluation: PACU Anesthesia Type: General Level of consciousness: awake and alert Pain management: pain level controlled Vital Signs Assessment: post-procedure vital signs reviewed and stable Respiratory status: spontaneous breathing, nonlabored ventilation, respiratory function stable and patient connected to nasal cannula oxygen Cardiovascular status: blood pressure returned to baseline and stable Postop Assessment: no apparent nausea or vomiting Anesthetic complications: no  No notable events documented.   Last Vitals:  Vitals:   08/15/22 1013 08/15/22 1033  BP: (!) 101/59 (!) 150/71  Pulse: 73 72  Resp: 16 18  Temp:  (!) 36.4 C  SpO2: 100% 99%    Last Pain:  Vitals:   08/16/22 1050  TempSrc:   PainSc: [place]

## 2023-06-02 ENCOUNTER — Emergency Department
Admission: EM | Admit: 2023-06-02 | Discharge: 2023-06-02 | Disposition: A | Discharge status: Home / Self Care | Payer: Medicare Other | Attending: Emergency Medicine | Admitting: Emergency Medicine

## 2023-06-02 ENCOUNTER — Other Ambulatory Visit: Payer: Self-pay

## 2023-06-02 DIAGNOSIS — B029 Zoster without complications: Secondary | ICD-10-CM | POA: Diagnosis not present

## 2023-06-02 DIAGNOSIS — R21 Rash and other nonspecific skin eruption: Secondary | ICD-10-CM | POA: Diagnosis present

## 2023-06-02 DIAGNOSIS — I129 Hypertensive chronic kidney disease with stage 1 through stage 4 chronic kidney disease, or unspecified chronic kidney disease: Secondary | ICD-10-CM | POA: Diagnosis not present

## 2023-06-02 DIAGNOSIS — N189 Chronic kidney disease, unspecified: Secondary | ICD-10-CM | POA: Insufficient documentation

## 2023-06-02 LAB — COMPREHENSIVE METABOLIC PANEL
ALT: 22 U/L (ref 0–44)
AST: 33 U/L (ref 15–41)
Albumin: 4 g/dL (ref 3.5–5.0)
Alkaline Phosphatase: 129 U/L — ABNORMAL HIGH (ref 38–126)
Anion gap: 11 (ref 5–15)
BUN: 15 mg/dL (ref 8–23)
CO2: 25 mmol/L (ref 22–32)
Calcium: 9.5 mg/dL (ref 8.9–10.3)
Chloride: 98 mmol/L (ref 98–111)
Creatinine, Ser: 0.71 mg/dL (ref 0.44–1.00)
GFR, Estimated: >60 mL/min (ref >60)
Glucose, Bld: 146 mg/dL — ABNORMAL HIGH (ref 70–99)
Potassium: 3.3 mmol/L — ABNORMAL LOW (ref 3.5–5.1)
Sodium: 134 mmol/L — ABNORMAL LOW (ref 135–145)
Total Bilirubin: 0.6 mg/dL (ref 0.3–1.2)
Total Protein: 8.4 g/dL — ABNORMAL HIGH (ref 6.5–8.1)

## 2023-06-02 LAB — URINALYSIS, ROUTINE W REFLEX MICROSCOPIC
Bacteria, UA: NONE SEEN
Bilirubin Urine: NEGATIVE
Glucose, UA: NEGATIVE mg/dL
Ketones, ur: NEGATIVE mg/dL
Leukocytes,Ua: NEGATIVE
Nitrite: NEGATIVE
Protein, ur: NEGATIVE mg/dL
Specific Gravity, Urine: 1.005 (ref 1.005–1.030)
pH: 6 (ref 5.0–8.0)

## 2023-06-02 LAB — LIPASE, BLOOD: Lipase: 25 U/L (ref 11–51)

## 2023-06-02 LAB — CBC
HCT: 38.8 % (ref 36.0–46.0)
Hemoglobin: 13 g/dL (ref 12.0–15.0)
MCH: 32.5 pg (ref 26.0–34.0)
MCHC: 33.5 g/dL (ref 30.0–36.0)
MCV: 97 fL (ref 80.0–100.0)
Platelets: 382 10*3/uL (ref 150–400)
RBC: 4 MIL/uL (ref 3.87–5.11)
RDW: 11.6 % (ref 11.5–15.5)
WBC: 8.7 10*3/uL (ref 4.0–10.5)
nRBC: 0 % (ref 0.0–0.2)

## 2023-06-02 NOTE — ED Provider Notes (Signed)
[facility] Provider Note    Event Date/Time   First MD Initiated Contact with Patient 06/02/23 1844     (approximate)  History   Chief Complaint: Abdominal Pain  HPI  FELITA BRANON is a 77 y.o. female with a past medical history of CKD, hypertension, presents to the emergency department for increasing pain to her right groin and buttock.  According to the patient for the past week or so she has had a rash to the right groin and buttock with significant pain to this area, states the pain is worse when she tries to urinate.  Daughter thinks she is urinating less than normal and has a history of CKD was concerned that she could have worsening kidney dysfunction so they came to the emergency department for evaluation.  Patient has been seen by her doctor for this rash diagnosed with shingles started on Valtrex and amoxicillin as they were not sure if it was a bacterial viral infection.  Started states the rash is actually improving but they stopped both of the medications because the patient was feeling nauseated.  Physical Exam   Triage Vital Signs: ED Triage Vitals  Encounter Vitals Group     BP 06/02/23 1748 (!) 182/117     Systolic BP Percentile --      Diastolic BP Percentile --      Pulse Rate 06/02/23 1748 82     Resp 06/02/23 1748 17     Temp 06/02/23 1748 98 F (36.7 C)     Temp Source 06/02/23 1748 Oral     SpO2 06/02/23 1748 97 %     Weight 06/02/23 1749 145 lb (65.8 kg)     Height 06/02/23 1749 5\' 3"  (1.6 m)     Head Circumference --      Peak Flow --      Pain Score 06/02/23 1749 0     Pain Loc --      Pain Education --      Exclude from Growth Chart --     Most recent vital signs: Vitals:   06/02/23 1748  BP: (!) 182/117  Pulse: 82  Resp: 17  Temp: 98 F (36.7 C)  SpO2: 97%    General: Awake, no distress.  CV:  Good peripheral perfusion.  Regular rate and rhythm  Resp:  Normal effort.  Equal breath sounds bilaterally.   Abd:  No distention.  Soft, nontender.  No rebound or guarding. Other:  Patient has a rash involving her right vulva extending to the proximal inner thigh and up the right buttocks.  Does not cross the midline.  Scabbed over in several areas, most consistent with shingles.   ED Results / Procedures / Treatments   MEDICATIONS ORDERED IN ED: Medications - No data to display   IMPRESSION / MDM / ASSESSMENT AND PLAN / ED COURSE  I reviewed the triage vital signs and the nursing notes.  Patient's presentation is most consistent with acute presentation with potential threat to life or bodily function.  Patient presents the emergency department for a rash to the right groin and the buttocks consistent with shingles on my evaluation.  Patient was already started on Valtrex.  This medication early due to nausea.  I discussed with the patient and daughter importance of starting this medication back again.  Daughter states her main concern is with the nausea and the patient feeling somewhat fatigued and wanted to ensure that her kidneys were okay.  Patient's  lab work today shows a reassuring CBC within normal chemistry including normal renal function anion gap of 11 with a GFR greater than 60.  Patient's lipase is normal and urinalysis is clear.  Patient and daughter both very reassured by the workup.  Daughter states the rash is actually much improved compared to several days ago.  Discussed with the patient to restart the Valtrex if she is able but does not need the amoxicillin.  They will follow-up with her PCP.   FINAL CLINICAL IMPRESSION(S) / ED DIAGNOSES   Shingles infection    Note:  This document was prepared using Dragon voice recognition software and may include unintentional dictation errors.   Minna Antis, MD 06/02/23 1939

## 2023-06-02 NOTE — ED Triage Notes (Signed)
Pt sts that she has shingles in her groin and was prescribed valtrex, however pt has not been able to urinate normally. Pt sts that she feels like she has to go all the time but pt thinks that she has a UTI with the shingles due to her placing cream around her vaginal opening.

## 2023-06-02 NOTE — Discharge Instructions (Addendum)
Please continue to take your Valtrex as prescribed by your primary care doctor until you have completed the course.  Please follow-up with your primary care doctor in 1 to 2 weeks for recheck/reevaluation.  Return to the emergency department for any symptom concerning to yourself.

## 2024-07-15 ENCOUNTER — Other Ambulatory Visit: Payer: Self-pay | Admitting: Family Medicine

## 2024-07-15 DIAGNOSIS — R1011 Right upper quadrant pain: Secondary | ICD-10-CM

## 2024-07-23 ENCOUNTER — Ambulatory Visit: Admission: RE | Admit: 2024-07-23 | Discharge: 2024-07-23 | Attending: Family Medicine | Admitting: Family Medicine

## 2024-07-23 DIAGNOSIS — R1011 Right upper quadrant pain: Secondary | ICD-10-CM | POA: Diagnosis present

## 2024-08-04 ENCOUNTER — Other Ambulatory Visit: Payer: Self-pay | Admitting: Family Medicine

## 2024-08-04 DIAGNOSIS — R1011 Right upper quadrant pain: Secondary | ICD-10-CM

## 2024-08-04 DIAGNOSIS — R935 Abnormal findings on diagnostic imaging of other abdominal regions, including retroperitoneum: Secondary | ICD-10-CM

## 2024-08-12 ENCOUNTER — Ambulatory Visit
Admission: RE | Admit: 2024-08-12 | Discharge: 2024-08-12 | Disposition: A | Discharge status: Home / Self Care | Source: Ambulatory Visit | Attending: Family Medicine | Admitting: Family Medicine

## 2024-08-12 DIAGNOSIS — R935 Abnormal findings on diagnostic imaging of other abdominal regions, including retroperitoneum: Secondary | ICD-10-CM | POA: Insufficient documentation

## 2024-08-12 DIAGNOSIS — R1011 Right upper quadrant pain: Secondary | ICD-10-CM | POA: Insufficient documentation

## 2024-08-12 MED ORDER — GADOBUTROL 1 MMOL/ML IV SOLN
7.0000 mL | Freq: Once | INTRAVENOUS | Status: AC | PRN
  Administered 2024-08-12: 7 mL via INTRAVENOUS
